# Patient Record
Sex: Male | Born: 1995 | Race: White | Hispanic: No | Marital: Single
Health system: Southern US, Community
[De-identification: ages and names within clinical notes are randomized; demographics above are authoritative.]

---
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
## 2019-06-27 ENCOUNTER — Emergency Department
Admission: EM | Admit: 2019-06-27 | Discharge: 2019-06-27 | Disposition: A | Discharge status: Home / Self Care | Payer: Self-pay | Attending: Emergency Medicine | Admitting: Emergency Medicine

## 2019-06-27 ENCOUNTER — Emergency Department: Payer: Self-pay

## 2019-06-27 ENCOUNTER — Other Ambulatory Visit: Payer: Self-pay

## 2019-06-27 DIAGNOSIS — R509 Fever, unspecified: Secondary | ICD-10-CM | POA: Insufficient documentation

## 2019-06-27 DIAGNOSIS — N200 Calculus of kidney: Secondary | ICD-10-CM | POA: Insufficient documentation

## 2019-06-27 LAB — URINALYSIS, COMPLETE (UACMP) WITH MICROSCOPIC
Bacteria, UA: NONE SEEN
Bilirubin Urine: NEGATIVE
Glucose, UA: NEGATIVE mg/dL
Ketones, ur: NEGATIVE mg/dL
Nitrite: NEGATIVE
Protein, ur: NEGATIVE mg/dL
RBC / HPF: >50 RBC/hpf — ABNORMAL HIGH (ref 0–5)
Specific Gravity, Urine: 1.017 (ref 1.005–1.030)
Squamous Epithelial / HPF: NONE SEEN (ref 0–5)
pH: 6 (ref 5.0–8.0)

## 2019-06-27 MED ORDER — CEPHALEXIN 500 MG PO CAPS
500.0000 mg | ORAL_CAPSULE | Freq: Once | ORAL | Status: AC
  Administered 2019-06-27: 500 mg via ORAL
  Filled 2019-06-27: qty 1

## 2019-06-27 MED ORDER — LACTATED RINGERS IV BOLUS
1000.0000 mL | Freq: Once | INTRAVENOUS | Status: AC
  Administered 2019-06-27: 1000 mL via INTRAVENOUS

## 2019-06-27 MED ORDER — ONDANSETRON 4 MG PO TBDP: 4.0000 mg | ORAL_TABLET | Freq: Three times a day (TID) | ORAL | 0 refills | Status: AC | PRN

## 2019-06-27 MED ORDER — SODIUM CHLORIDE 0.9% FLUSH: 3.0000 mL | Freq: Once | INTRAVENOUS | Status: DC

## 2019-06-27 MED ORDER — IOHEXOL 300 MG/ML  SOLN
125.0000 mL | Freq: Once | INTRAMUSCULAR | Status: AC | PRN
  Administered 2019-06-27: 125 mL via INTRAVENOUS

## 2019-06-27 MED ORDER — CEPHALEXIN 500 MG PO CAPS
500.0000 mg | ORAL_CAPSULE | Freq: Two times a day (BID) | ORAL | 0 refills | Status: AC
Start: 2019-06-27 — End: 2019-07-04

## 2019-06-27 MED ORDER — OXYCODONE-ACETAMINOPHEN 5-325 MG PO TABS: 1.0000 | ORAL_TABLET | ORAL | 0 refills | Status: AC | PRN

## 2019-06-27 NOTE — ED Notes (Signed)
ED Provider at bedside. 

## 2019-06-27 NOTE — ED Provider Notes (Signed)
[facility] Emergency Department Provider Note   ____________________________________________   First MD Initiated Contact with Patient 06/27/19 1350     (approximate)  I have reviewed the triage vital signs and the nursing notes.   HISTORY  Chief Complaint Abdominal Pain    HPI William Owen. is a 24 y.o. male with no significant past medical history who presents to the ED complaining of abdominal pain.  Patient reports he has had a couple days of intermittent right lower quadrant abdominal pain.  He describes the pain as sharp, will wax and wane in severity but is not exacerbated or alleviated by anything in particular.  He had a more severe episode of pain that seem to radiate into his right flank earlier today, was initially evaluated at the walk-in clinic but referred to the ED for evaluation when he was found to have a temperature of 100.1.  He denies noticing any fevers at home, has not had any dysuria, hematuria, nausea, vomiting, cough, chest pain, or shortness of breath.  He denies any history of kidney stones, has never had abdominal surgery.        History reviewed. No pertinent past medical history.  There are no problems to display for this patient.   History reviewed. No pertinent surgical history.  Prior to Admission medications   Medication Sig Start Date End Date Taking? Authorizing Provider  cephALEXin (KEFLEX) 500 MG capsule Take 1 capsule (500 mg total) by mouth 2 (two) times daily for 7 days. 06/27/19 07/04/19  Chesley Noon, MD  ondansetron (ZOFRAN ODT) 4 MG disintegrating tablet Take 1 tablet (4 mg total) by mouth every 8 (eight) hours as needed for nausea or vomiting. 06/27/19   Chesley Noon, MD  oxyCODONE-acetaminophen (PERCOCET) 5-325 MG tablet Take 1 tablet by mouth every 4 (four) hours as needed. 06/27/19 06/26/20  Chesley Noon, MD    Allergies Patient has no allergy information on record.  History reviewed. No pertinent  family history.  Social History Social History   Tobacco Use  . Smoking status: Not on file  Substance Use Topics  . Alcohol use: Not on file  . Drug use: Not on file    Review of Systems  Constitutional: No fever/chills Eyes: No visual changes. ENT: No sore throat. Cardiovascular: Denies chest pain. Respiratory: Denies shortness of breath. Gastrointestinal: Positive for abdominal pain.  No nausea, no vomiting.  No diarrhea.  No constipation. Genitourinary: Negative for dysuria. Musculoskeletal: Negative for back pain. Skin: Negative for rash. Neurological: Negative for headaches, focal weakness or numbness.  ____________________________________________   PHYSICAL EXAM:  VITAL SIGNS: ED Triage Vitals  Enc Vitals Group     BP 06/27/19 1325 (!) 177/87     Pulse Rate 06/27/19 1325 68     Resp 06/27/19 1325 18     Temp 06/27/19 1325 99.2 F (37.3 C)     Temp Source 06/27/19 1325 Oral     SpO2 06/27/19 1325 100 %     Weight 06/27/19 1326 236 lb (107 kg)     Height 06/27/19 1326 5\' 9"  (1.753 m)     Head Circumference --      Peak Flow --      Pain Score 06/27/19 1325 2     Pain Loc --      Pain Edu? --      Excl. in GC? --     Constitutional: Alert and oriented. Eyes: Conjunctivae are normal. Head: Atraumatic. Nose: No congestion/rhinnorhea. Mouth/Throat:  Mucous membranes are moist. Neck: Normal ROM Cardiovascular: Normal rate, regular rhythm. Grossly normal heart sounds. Respiratory: Normal respiratory effort.  No retractions. Lungs CTAB. Gastrointestinal: Soft and tender to palpation in the right lower quadrant with no rebound or guarding.  No CVA tenderness bilaterally. No distention. Genitourinary: deferred Musculoskeletal: No lower extremity tenderness nor edema. Neurologic:  Normal speech and language. No gross focal neurologic deficits are appreciated. Skin:  Skin is warm, dry and intact. No rash noted. Psychiatric: Mood and affect are normal. Speech  and behavior are normal.  ____________________________________________   LABS (all labs ordered are listed, but only abnormal results are displayed)  Labs Reviewed  URINALYSIS, COMPLETE (UACMP) WITH MICROSCOPIC - Abnormal; Notable for the following components:      Result Value   Color, Urine YELLOW (*)    APPearance HAZY (*)    Hgb urine dipstick MODERATE (*)    Leukocytes,Ua MODERATE (*)    RBC / HPF >50 (*)    All other components within normal limits  URINE CULTURE    PROCEDURES  Procedure(s) performed (including Critical Care):  Procedures   ____________________________________________   INITIAL IMPRESSION / ASSESSMENT AND PLAN / ED COURSE       24 year old male with no significant past medical history presents to the ED complaining of intermittent right lower quadrant abdominal pain for the past 2 to 3 days, had a severe episode earlier today that radiated to his right flank.  He has some tenderness in his right lower quadrant on exam, but no CVA tenderness.  Lab work reviewed from walk-in clinic and is significant for leukocytosis as well as hematuria, UA equivocal for UTI.  We will repeat UA here in the ED, check CT scan for appendicitis versus nephrolithiasis.  Patient declines pain medication at this time, we will hydrate with IV fluids.  CT scan shows right-sided 3 mm nonobstructing ureterolith consistent with the etiology of patient's pain.  No evidence of appendicitis.  Case discussed with Dr. Bernardo Heater of urology, who agrees patient is appropriate for discharge home but recommend starting on a course of antibiotics given borderline fever and leukocytosis.  Patient given initial dose of Keflex here in the ED, prescriptions for Percocet and Zofran sent to his pharmacy.  He was counseled to follow-up with urology and otherwise return to the ED for new worsening symptoms, patient agrees with plan.      ____________________________________________   FINAL CLINICAL  IMPRESSION(S) / ED DIAGNOSES  Final diagnoses:  Nephrolithiasis     ED Discharge Orders         Ordered    cephALEXin (KEFLEX) 500 MG capsule  2 times daily     06/27/19 1804    oxyCODONE-acetaminophen (PERCOCET) 5-325 MG tablet  Every 4 hours PRN     06/27/19 1809    ondansetron (ZOFRAN ODT) 4 MG disintegrating tablet  Every 8 hours PRN     06/27/19 1809           Note:  This document was prepared using Dragon voice recognition software and may include unintentional dictation errors.   Blake Divine, MD 06/27/19 [phone]

## 2019-06-27 NOTE — ED Notes (Signed)
Pt taken to CT.

## 2019-06-27 NOTE — ED Triage Notes (Addendum)
First nurse note- RLQ pain/LLQ pain.  Temp 100.1 at [facility]. WBC 14.6

## 2019-06-27 NOTE — ED Triage Notes (Signed)
Pt arrives via POV from [facility] for c/o generalized lower abdominal pain x 2-3 days, accompanied with fever. Labs done at [facility]. Pt A&Ox4 and in NAD.

## 2019-06-28 LAB — URINE CULTURE: Culture: NO GROWTH

## 2019-07-03 ENCOUNTER — Telehealth: Payer: Self-pay | Admitting: Urology

## 2019-07-03 NOTE — Telephone Encounter (Signed)
returned vm for pt wife to schedule Ed f/u apt with Dr. Lonna Cobb for kidney stones. No vm to offer apt

## 2019-10-05 ENCOUNTER — Other Ambulatory Visit: Payer: Self-pay | Admitting: Oncology

## 2019-10-05 DIAGNOSIS — U071 COVID-19: Secondary | ICD-10-CM

## 2019-10-05 NOTE — Progress Notes (Signed)
I connected by phone with  William Owen to discuss the potential use of an new treatment for mild to moderate COVID-19 viral infection in non-hospitalized patients.   This patient is a age/sex that meets the FDA criteria for Emergency Use Authorization of casirivimab\imdevimab.  Has a (+) direct SARS-CoV-2 viral test result 1. Has mild or moderate COVID-19  2. Is ? 23 years of age and weighs ? 40 kg 3. Is NOT hospitalized due to COVID-19 4. Is NOT requiring oxygen therapy or requiring an increase in baseline oxygen flow rate due to COVID-19 5. Is within 10 days of symptom onset 6. Has at least one of the high risk factor(s) for progression to severe COVID-19 and/or hospitalization as defined in EUA. ? Specific high risk criteria :No past medical history on file. ? High Risk- BMI > 25   Symptom onset 10/04/2019   I have spoken and communicated the following to the patient or parent/caregiver:   1. FDA has authorized the emergency use of casirivimab\imdevimab for the treatment of mild to moderate COVID-19 in adults and pediatric patients with positive results of direct SARS-CoV-2 viral testing who are 61 years of age and older weighing at least 40 kg, and who are at high risk for progressing to severe COVID-19 and/or hospitalization.   2. The significant known and potential risks and benefits of casirivimab\imdevimab, and the extent to which such potential risks and benefits are unknown.   3. Information on available alternative treatments and the risks and benefits of those alternatives, including clinical trials.   4. Patients treated with casirivimab\imdevimab should continue to self-isolate and use infection control measures (e.g., wear mask, isolate, social distance, avoid sharing personal items, clean and disinfect "high touch" surfaces, and frequent handwashing) according to CDC guidelines.    5. The patient or parent/caregiver has the option to accept or refuse casirivimab\imdevimab .    After reviewing this information with the patient, The patient agreed to proceed with receiving casirivimab\imdevimab infusion and will be provided a copy of the Fact sheet prior to receiving the infusion.Mignon Pine, AGNP-C [phone] (Infusion Center Hotline)

## 2019-10-06 ENCOUNTER — Ambulatory Visit (HOSPITAL_COMMUNITY)
Admission: RE | Admit: 2019-10-06 | Discharge: 2019-10-06 | Disposition: A | Discharge status: Home / Self Care | Payer: HRSA Program | Source: Ambulatory Visit | Attending: Pulmonary Disease | Admitting: Pulmonary Disease

## 2019-10-06 ENCOUNTER — Other Ambulatory Visit (HOSPITAL_COMMUNITY): Payer: Self-pay

## 2019-10-06 DIAGNOSIS — U071 COVID-19: Secondary | ICD-10-CM | POA: Insufficient documentation

## 2019-10-06 MED ORDER — FAMOTIDINE IN NACL 20-0.9 MG/50ML-% IV SOLN: 20.0000 mg | Freq: Once | INTRAVENOUS | Status: DC | PRN

## 2019-10-06 MED ORDER — ALBUTEROL SULFATE HFA 108 (90 BASE) MCG/ACT IN AERS: 2.0000 | INHALATION_SPRAY | Freq: Once | RESPIRATORY_TRACT | Status: DC | PRN

## 2019-10-06 MED ORDER — SODIUM CHLORIDE 0.9 % IV SOLN
1200.0000 mg | Freq: Once | INTRAVENOUS | Status: AC
  Administered 2019-10-06: 1200 mg via INTRAVENOUS

## 2019-10-06 MED ORDER — ACETAMINOPHEN 325 MG PO TABS
650.0000 mg | ORAL_TABLET | Freq: Once | ORAL | Status: AC
  Administered 2019-10-06: 650 mg via ORAL
  Filled 2019-10-06: qty 2

## 2019-10-06 MED ORDER — DIPHENHYDRAMINE HCL 50 MG/ML IJ SOLN: 50.0000 mg | Freq: Once | INTRAMUSCULAR | Status: DC | PRN

## 2019-10-06 MED ORDER — SODIUM CHLORIDE 0.9 % IV SOLN: INTRAVENOUS | Status: DC | PRN

## 2019-10-06 MED ORDER — EPINEPHRINE 0.3 MG/0.3ML IJ SOAJ: 0.3000 mg | Freq: Once | INTRAMUSCULAR | Status: DC | PRN

## 2019-10-06 MED ORDER — METHYLPREDNISOLONE SODIUM SUCC 125 MG IJ SOLR: 125.0000 mg | Freq: Once | INTRAMUSCULAR | Status: DC | PRN

## 2019-10-06 NOTE — Progress Notes (Signed)
  Diagnosis: COVID-19  Physician:dr wright  Procedure: [facility] Med: casirivimab\imdevimab infusion - Provided patient with casirivimab\imdevimab fact sheet for patients, parents and caregivers prior to infusion.  Complications: No immediate complications noted.  Discharge: Discharged home   Secilia Apps S Leticia Mcdiarmid 10/06/2019  

## 2019-10-06 NOTE — Discharge Instructions (Signed)
COVID-19 COVID-19 is a respiratory infection that is caused by a virus called severe acute respiratory syndrome coronavirus 2 (SARS-CoV-2). The disease is also known as coronavirus disease or novel coronavirus. In some people, the virus may not cause any symptoms. In others, it may cause a serious infection. The infection can get worse quickly and can lead to complications, such as:  Pneumonia, or infection of the lungs.  Acute respiratory distress syndrome or ARDS. This is a condition in which fluid build-up in the lungs prevents the lungs from filling with air and passing oxygen into the blood.  Acute respiratory failure. This is a condition in which there is not enough oxygen passing from the lungs to the body or when carbon dioxide is not passing from the lungs out of the body.  Sepsis or septic shock. This is a serious bodily reaction to an infection.  Blood clotting problems.  Secondary infections due to bacteria or fungus.  Organ failure. This is when your body's organs stop working. The virus that causes COVID-19 is contagious. This means that it can spread from person to person through droplets from coughs and sneezes (respiratory secretions). What are the causes? This illness is caused by a virus. You may catch the virus by:  Breathing in droplets from an infected person. Droplets can be spread by a person breathing, speaking, singing, coughing, or sneezing.  Touching something, like a table or a doorknob, that was exposed to the virus (contaminated) and then touching your mouth, nose, or eyes. What increases the risk? Risk for infection You are more likely to be infected with this virus if you:  Are within 6 feet (2 meters) of a person with COVID-19.  Provide care for or live with a person who is infected with COVID-19.  Spend time in crowded indoor spaces or live in shared housing. Risk for serious illness You are more likely to become seriously ill from the virus if  you:  Are 50 years of age or older. The higher your age, the more you are at risk for serious illness.  Live in a nursing home or long-term care facility.  Have cancer.  Have a long-term (chronic) disease such as: ? Chronic lung disease, including chronic obstructive pulmonary disease or asthma. ? A long-term disease that lowers your body's ability to fight infection (immunocompromised). ? Heart disease, including heart failure, a condition in which the arteries that lead to the heart become narrow or blocked (coronary artery disease), a disease which makes the heart muscle thick, weak, or stiff (cardiomyopathy). ? Diabetes. ? Chronic kidney disease. ? Sickle cell disease, a condition in which red blood cells have an abnormal "sickle" shape. ? Liver disease.  Are obese. What are the signs or symptoms? Symptoms of this condition can range from mild to severe. Symptoms may appear any time from 2 to 14 days after being exposed to the virus. They include:  A fever or chills.  A cough.  Difficulty breathing.  Headaches, body aches, or muscle aches.  Runny or stuffy (congested) nose.  A sore throat.  New loss of taste or smell. Some people may also have stomach problems, such as nausea, vomiting, or diarrhea. Other people may not have any symptoms of COVID-19. How is this diagnosed? This condition may be diagnosed based on:  Your signs and symptoms, especially if: ? You live in an area with a COVID-19 outbreak. ? You recently traveled to or from an area where the virus is common. ? You   provide care for or live with a person who was diagnosed with COVID-19. ? You were exposed to a person who was diagnosed with COVID-19.  A physical exam.  Lab tests, which may include: ? Taking a sample of fluid from the back of your nose and throat (nasopharyngeal fluid), your nose, or your throat using a swab. ? A sample of mucus from your lungs (sputum). ? Blood tests.  Imaging tests,  which may include, X-rays, CT scan, or ultrasound. How is this treated? At present, there is no medicine to treat COVID-19. Medicines that treat other diseases are being used on a trial basis to see if they are effective against COVID-19. Your health care provider will talk with you about ways to treat your symptoms. For most people, the infection is mild and can be managed at home with rest, fluids, and over-the-counter medicines. Treatment for a serious infection usually takes places in a hospital intensive care unit (ICU). It may include one or more of the following treatments. These treatments are given until your symptoms improve.  Receiving fluids and medicines through an IV.  Supplemental oxygen. Extra oxygen is given through a tube in the nose, a face mask, or a hood.  Positioning you to lie on your stomach (prone position). This makes it easier for oxygen to get into the lungs.  Continuous positive airway pressure (CPAP) or bi-level positive airway pressure (BPAP) machine. This treatment uses mild air pressure to keep the airways open. A tube that is connected to a motor delivers oxygen to the body.  Ventilator. This treatment moves air into and out of the lungs by using a tube that is placed in your windpipe.  Tracheostomy. This is a procedure to create a hole in the neck so that a breathing tube can be inserted.  Extracorporeal membrane oxygenation (ECMO). This procedure gives the lungs a chance to recover by taking over the functions of the heart and lungs. It supplies oxygen to the body and removes carbon dioxide. Follow these instructions at home: Lifestyle  If you are sick, stay home except to get medical care. Your health care provider will tell you how long to stay home. Call your health care provider before you go for medical care.  Rest at home as told by your health care provider.  Do not use any products that contain nicotine or tobacco, such as cigarettes,  e-cigarettes, and chewing tobacco. If you need help quitting, ask your health care provider.  Return to your normal activities as told by your health care provider. Ask your health care provider what activities are safe for you. General instructions  Take over-the-counter and prescription medicines only as told by your health care provider.  Drink enough fluid to keep your urine pale yellow.  Keep all follow-up visits as told by your health care provider. This is important. How is this prevented?  There is no vaccine to help prevent COVID-19 infection. However, there are steps you can take to protect yourself and others from this virus. To protect yourself:   Do not travel to areas where COVID-19 is a risk. The areas where COVID-19 is reported change often. To identify high-risk areas and travel restrictions, check the CDC travel website: wwwnc.cdc.gov/travel/notices  If you live in, or must travel to, an area where COVID-19 is a risk, take precautions to avoid infection. ? Stay away from people who are sick. ? Wash your hands often with soap and water for 20 seconds. If soap and water   are not available, use an alcohol-based hand sanitizer. ? Avoid touching your mouth, face, eyes, or nose. ? Avoid going out in public, follow guidance from your state and local health authorities. ? If you must go out in public, wear a cloth face covering or face mask. Make sure your mask covers your nose and mouth. ? Avoid crowded indoor spaces. Stay at least 6 feet (2 meters) away from others. ? Disinfect objects and surfaces that are frequently touched every day. This may include:  Counters and tables.  Doorknobs and light switches.  Sinks and faucets.  Electronics, such as phones, remote controls, keyboards, computers, and tablets. To protect others: If you have symptoms of COVID-19, take steps to prevent the virus from spreading to others.  If you think you have a COVID-19 infection, contact  your health care provider right away. Tell your health care team that you think you may have a COVID-19 infection.  Stay home. Leave your house only to seek medical care. Do not use public transport.  Do not travel while you are sick.  Wash your hands often with soap and water for 20 seconds. If soap and water are not available, use alcohol-based hand sanitizer.  Stay away from other members of your household. Let healthy household members care for children and pets, if possible. If you have to care for children or pets, wash your hands often and wear a mask. If possible, stay in your own room, separate from others. Use a different bathroom.  Make sure that all people in your household wash their hands well and often.  Cough or sneeze into a tissue or your sleeve or elbow. Do not cough or sneeze into your hand or into the air.  Wear a cloth face covering or face mask. Make sure your mask covers your nose and mouth. Where to find more information  Centers for Disease Control and Prevention: www.cdc.gov/coronavirus/2019-ncov/index.html  World Health Organization: www.who.int/health-topics/coronavirus Contact a health care provider if:  You live in or have traveled to an area where COVID-19 is a risk and you have symptoms of the infection.  You have had contact with someone who has COVID-19 and you have symptoms of the infection. Get help right away if:  You have trouble breathing.  You have pain or pressure in your chest.  You have confusion.  You have bluish lips and fingernails.  You have difficulty waking from sleep.  You have symptoms that get worse. These symptoms may represent a serious problem that is an emergency. Do not wait to see if the symptoms will go away. Get medical help right away. Call your local emergency services (911 in the [place] Do not drive yourself to the hospital. Let the emergency medical personnel know if you think you have  COVID-19. Summary  COVID-19 is a respiratory infection that is caused by a virus. It is also known as coronavirus disease or novel coronavirus. It can cause serious infections, such as pneumonia, acute respiratory distress syndrome, acute respiratory failure, or sepsis.  The virus that causes COVID-19 is contagious. This means that it can spread from person to person through droplets from breathing, speaking, singing, coughing, or sneezing.  You are more likely to develop a serious illness if you are 50 years of age or older, have a weak immune system, live in a nursing home, or have chronic disease.  There is no medicine to treat COVID-19. Your health care provider will talk with you about ways to treat your symptoms.    Take steps to protect yourself and others from infection. Wash your hands often and disinfect objects and surfaces that are frequently touched every day. Stay away from people who are sick and wear a mask if you are sick. This information is not intended to replace advice given to you by your health care provider. Make sure you discuss any questions you have with your health care provider. Document Revised: 11/04/2018 Document Reviewed: 02/10/2018 Elsevier Patient Education  2020 [employer] What types of side effects do monoclonal antibody drugs cause?  Common side effects  In general, the more common side effects caused by monoclonal antibody drugs include: . Allergic reactions, such as hives or itching . Flu-like signs and symptoms, including chills, fatigue, fever, and muscle aches and pains . Nausea, vomiting . Diarrhea . Skin rashes . Low blood pressure   The CDC is recommending patients who receive monoclonal antibody treatments wait at least 90 days before being vaccinated.  Currently, there are no data on the safety and efficacy of mRNA COVID-19 vaccines in persons who received monoclonal antibodies or convalescent plasma as part of COVID-19 treatment. Based  on the estimated half-life of such therapies as well as evidence suggesting that reinfection is uncommon in the 90 days after initial infection, vaccination should be deferred for at least 90 days, as a precautionary measure until additional information becomes available, to avoid interference of the antibody treatment with vaccine-induced immune responses. 

## 2021-11-20 IMAGING — CT CT ABD-PELV W/ CM
2 of 4 series · 16 of 46 positions shown, 18 images · IV contrast (APPLIED)
Comparison: None.

CLINICAL DATA: Right lower quadrant abdominal pain for several
days, fever, leukocytosis

EXAM:
CT ABDOMEN AND PELVIS WITH CONTRAST
TECHNIQUE: Multidetector CT imaging of the abdomen and pelvis was performed
using the standard protocol following bolus administration of
intravenous contrast.
CONTRAST:  125mL OMNIPAQUE IOHEXOL 300 MG/ML  SOLN

[Series 2: routine abd/pel with · axial · 0.89mm/px · z∈[-488,-43]mm · 13 of 99 slices shown, 15 images]
[im 5/99  soft-tissue]
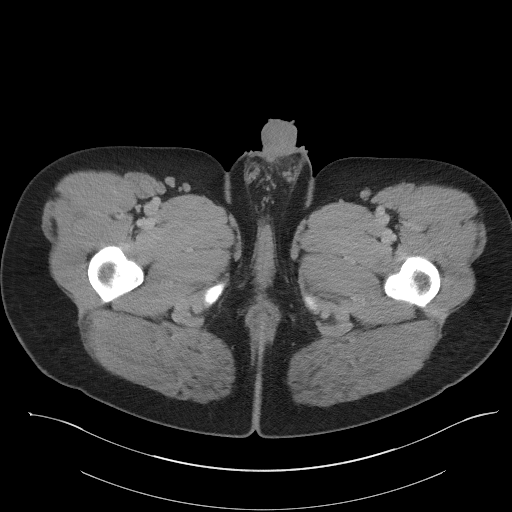
[im 5/99  bone]
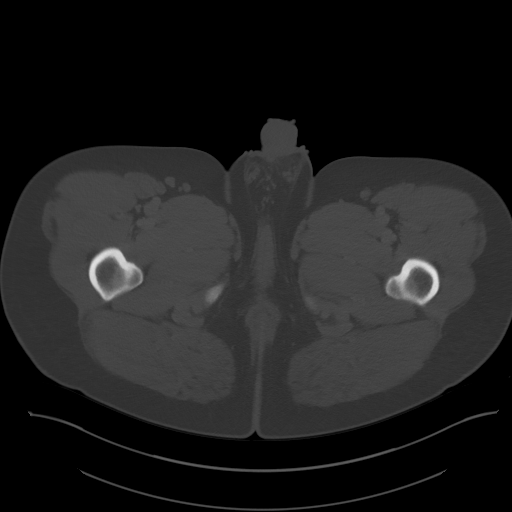
[im 14/99  soft-tissue]
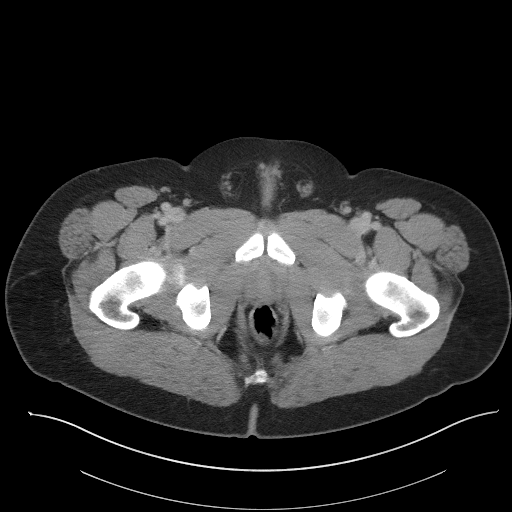
[im 23/99  soft-tissue]
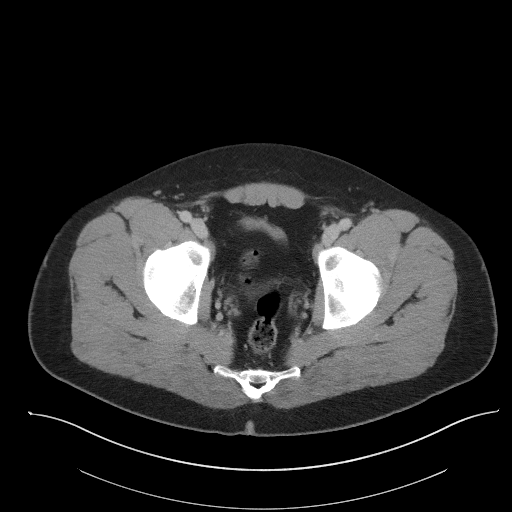
[im 27/99  soft-tissue]
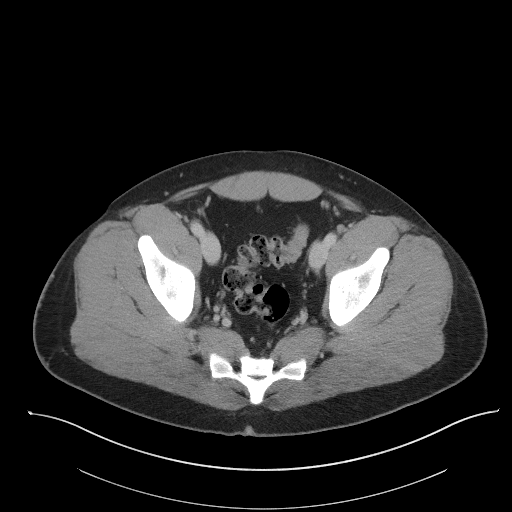
[im 36/99  soft-tissue]
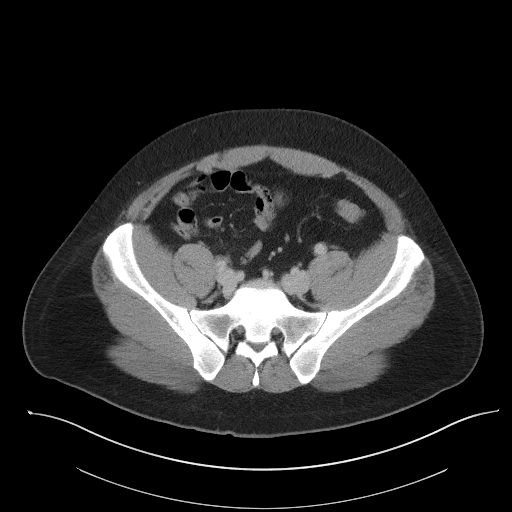
[im 41/99  soft-tissue]
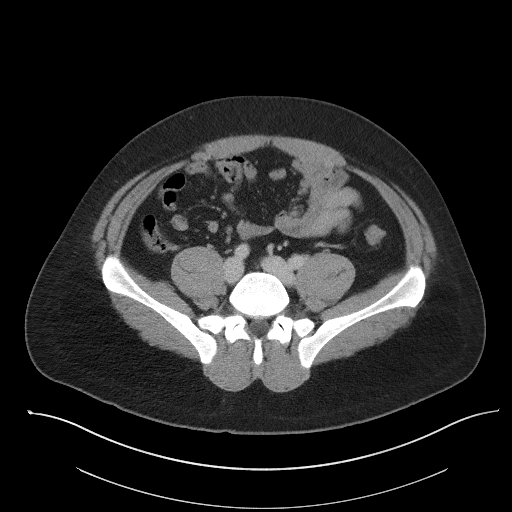
[im 49/99  soft-tissue]
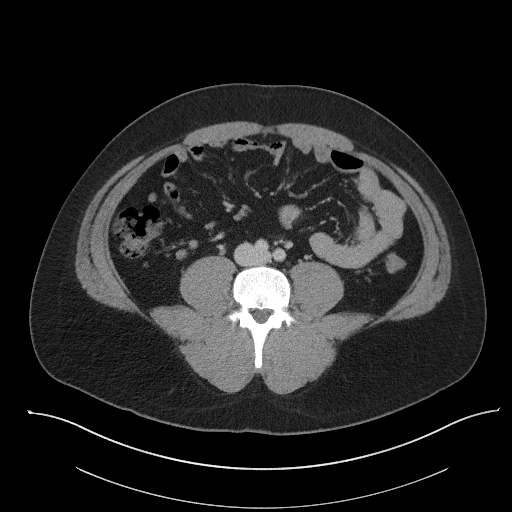
[im 58/99  soft-tissue]
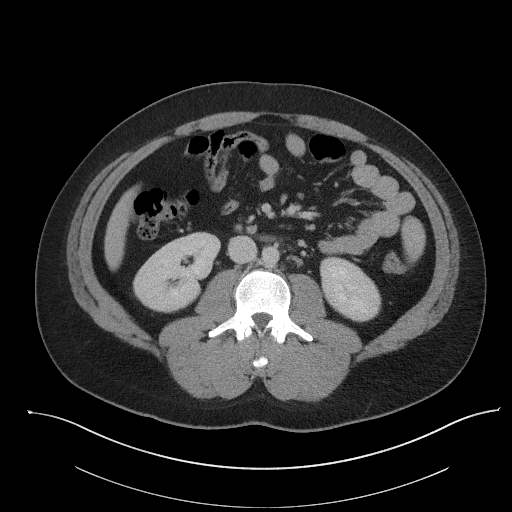
[im 63/99  soft-tissue]
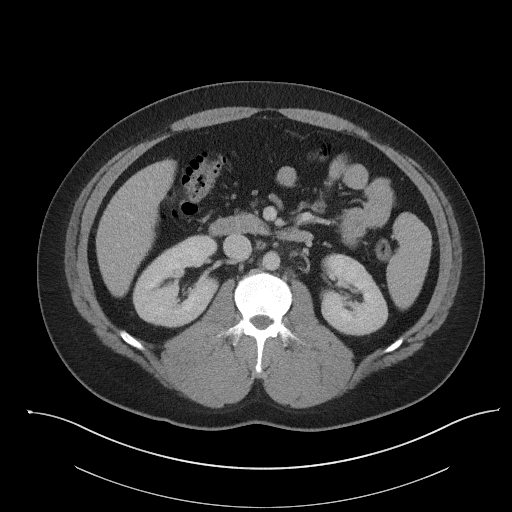
[im 63/99  bone]
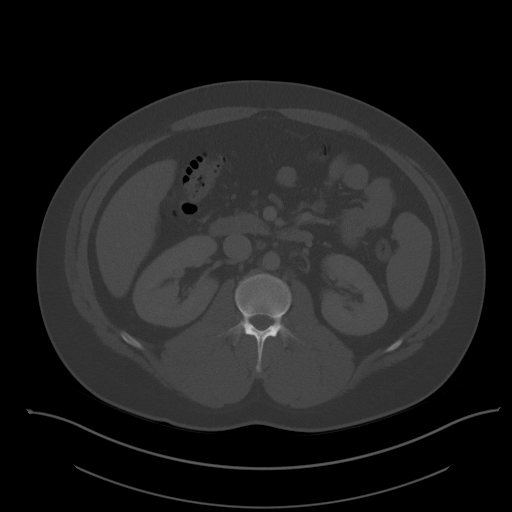
[im 72/99  soft-tissue]
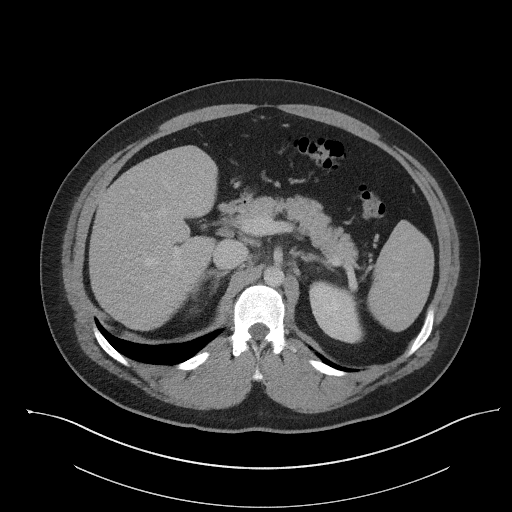
[im 76/99  soft-tissue]
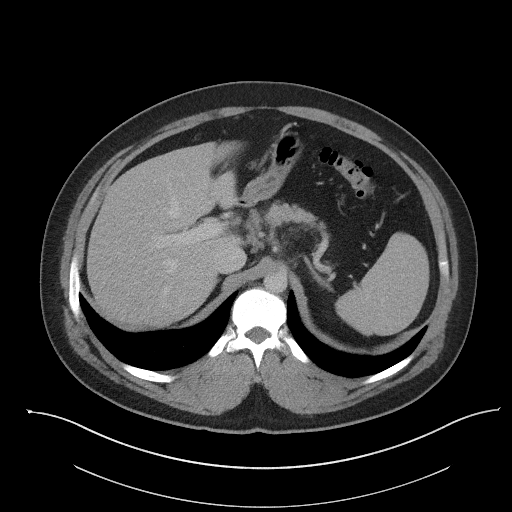
[im 85/99  soft-tissue]
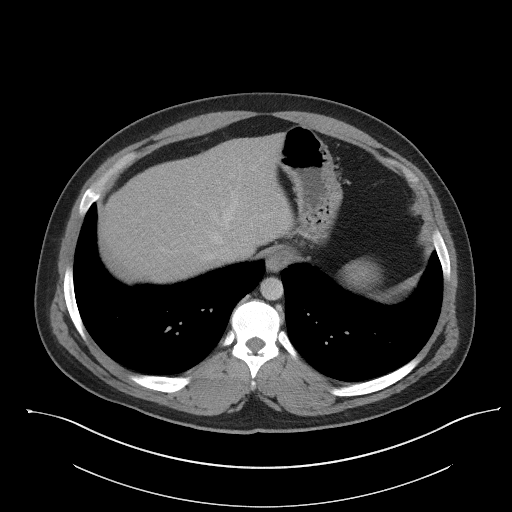
[im 94/99  soft-tissue]
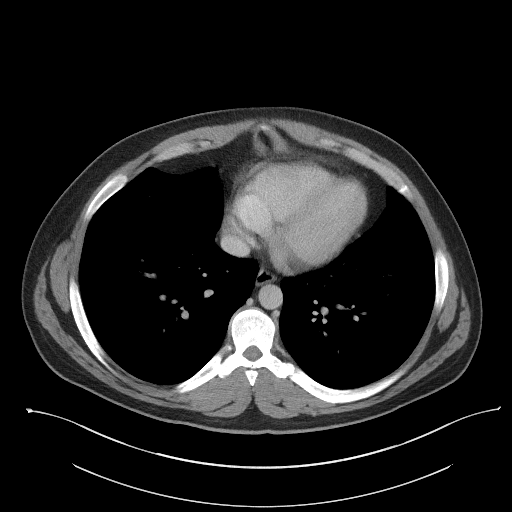

[Series 5: coronal st · coronal · 0.83mm/px · 3 of 108 slices shown]
[im 36/108  soft-tissue]
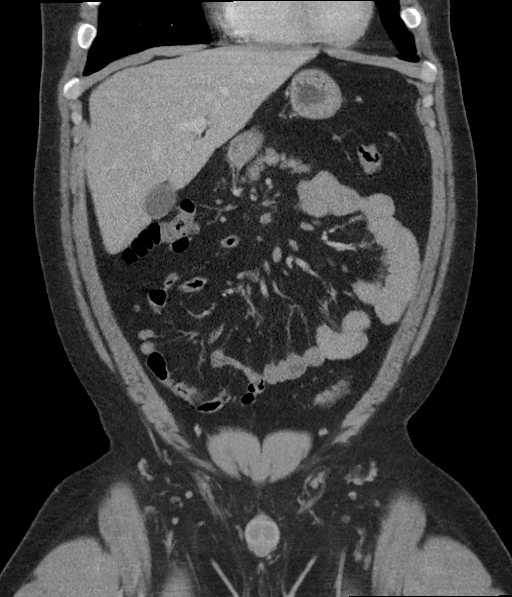
[im 48/108  soft-tissue]
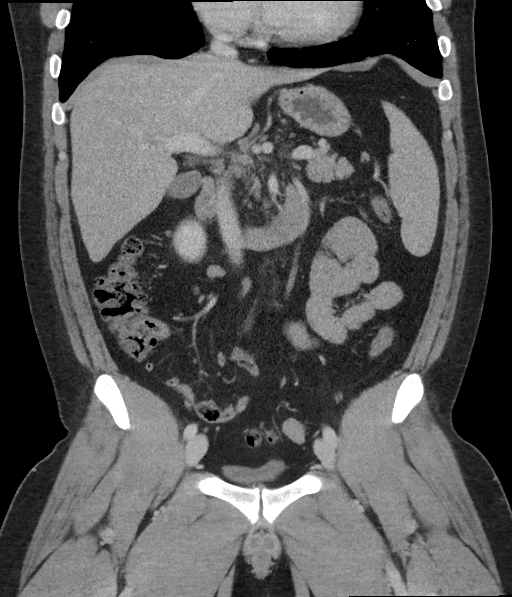
[im 60/108  soft-tissue]
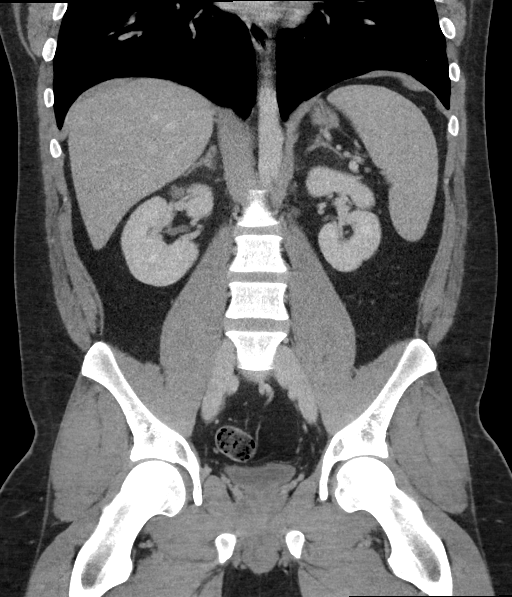

[16 of 46 positions shown; findings below may reference images not displayed]

FINDINGS: Lower chest: No acute pleural or parenchymal lung disease.

Hepatobiliary: No focal liver abnormality is seen. No gallstones,
gallbladder wall thickening, or biliary dilatation.

Pancreas: Unremarkable. No pancreatic ductal dilatation or
surrounding inflammatory changes.

Spleen: Normal in size without focal abnormality.

Adrenals/Urinary Tract: There is a nonobstructing 3 mm calculus at
the right UVJ, reference image 79. No obstructive uropathy within
either kidney. The adrenals are normal.

Stomach/Bowel: No bowel obstruction or ileus. Normal appendix right
lower quadrant. No bowel wall thickening or inflammatory change.

Vascular/Lymphatic: No significant atherosclerosis. There are
numerous subcentimeter lymph nodes throughout the mesentery,
nonspecific but likely reactive. No pathologic adenopathy.

Reproductive: Prostate is unremarkable.

Other: No abdominal wall hernia or abnormality. No abdominopelvic
ascites.

Musculoskeletal: No acute or destructive bony lesions. Reconstructed
images demonstrate no additional findings.
IMPRESSION: 1. Nonobstructing 3 mm calculus at the right UVJ. No obstructive
uropathy within either kidney.
2. Numerous subcentimeter lymph nodes throughout the mesentery,
nonspecific but likely reactive.
3. Normal appendix.
